# Patient Record
Sex: Male | Born: 1960 | Race: Black or African American | Hispanic: No | Marital: Single | Smoking: Never smoker
Health system: Southern US, Community
[De-identification: ages and names within clinical notes are randomized; demographics above are authoritative.]

## PROBLEM LIST (undated history)

## (undated) DIAGNOSIS — K4090 Unilateral inguinal hernia, without obstruction or gangrene, not specified as recurrent: Secondary | ICD-10-CM

## (undated) HISTORY — DX: Unilateral inguinal hernia, without obstruction or gangrene, not specified as recurrent: K40.90

## (undated) HISTORY — PX: VASECTOMY: SHX75

---

## 2002-02-20 HISTORY — PX: WRIST SURGERY: SHX841

## 2013-02-20 DIAGNOSIS — K4090 Unilateral inguinal hernia, without obstruction or gangrene, not specified as recurrent: Secondary | ICD-10-CM

## 2013-02-20 HISTORY — DX: Unilateral inguinal hernia, without obstruction or gangrene, not specified as recurrent: K40.90

## 2013-07-04 ENCOUNTER — Encounter (INDEPENDENT_AMBULATORY_CARE_PROVIDER_SITE_OTHER): Payer: Self-pay | Admitting: General Surgery

## 2013-07-04 ENCOUNTER — Ambulatory Visit (INDEPENDENT_AMBULATORY_CARE_PROVIDER_SITE_OTHER): Payer: 59 | Admitting: General Surgery

## 2013-07-04 VITALS — BP 130/84 | HR 71 | Temp 97.6°F | Resp 16 | Ht 68.0 in | Wt 192.8 lb

## 2013-07-04 DIAGNOSIS — K4090 Unilateral inguinal hernia, without obstruction or gangrene, not specified as recurrent: Secondary | ICD-10-CM

## 2013-07-04 NOTE — Progress Notes (Signed)
Patient ID: Scott Gay Vilar, male   DOB: [birth year], 53 y.o.   MRN: [id number]  No chief complaint on file.   HPI Scott Gay Bangs is a 53 y.o. male.  The patient is a 53 year old male who is referred by Dr. Su GrandMarc Nesi for evaluation of a right inguinal hernia. The patient states that it's been there for approximately a month. He states he has some discomfort in his right inguinal area. He notices a bulge in his right groin. The patient had no signs or symptoms of incarceration. HPI  Past Medical History  Diagnosis Date  . Inguinal hernia 2015    No past surgical history on file.  No family history on file.  Social History History  Substance Use Topics  . Smoking status: Never Smoker   . Smokeless tobacco: Not on file  . Alcohol Use: Yes    No Known Allergies  No current outpatient prescriptions on file.   No current facility-administered medications for this visit.    Review of Systems Review of Systems  Constitutional: Negative.   HENT: Negative.   Eyes: Negative.   Respiratory: Negative.   Cardiovascular: Negative.   Gastrointestinal: Negative.   Endocrine: Negative.   Neurological: Negative.     Blood pressure 130/84, pulse 71, temperature 97.6 F (36.4 C), temperature source Temporal, resp. rate 16, height 5\' 8"  (1.727 m), weight 192 lb 12.8 oz (87.454 kg).  Physical Exam Physical Exam  Constitutional: He is oriented to person, place, and time. He appears well-developed and well-nourished.  HENT:  Head: Normocephalic and atraumatic.  Eyes: Conjunctivae and EOM are normal. Pupils are equal, round, and reactive to light.  Neck: Normal range of motion. Neck supple.  Cardiovascular: Normal rate, regular rhythm and normal heart sounds.   Pulmonary/Chest: Effort normal and breath sounds normal.  Abdominal: Soft. Bowel sounds are normal. He exhibits no distension and no mass. There is no tenderness. There is no rebound and no guarding. A hernia is present. Hernia  confirmed positive in the right inguinal area. Hernia confirmed negative in the left inguinal area.  Musculoskeletal: Normal range of motion.  Neurological: He is alert and oriented to person, place, and time.  Skin: Skin is warm and dry.    Data Reviewed none  Assessment    53 year old male with right inguinal hernia, likely indirect     Plan    1. The patient would like to proceed to the operating room for a laparoscopic right inguinal hernia repair with mesh. 2. All risks and benefits were discussed with the patient, to generally include infection, bleeding, damage to surrounding structures, acute and chronic nerve pain, and recurrence. Alternatives were offered and described.  All questions were answered and the patient voiced understanding of the procedure and wishes to proceed at this point.         Axel Fillerrmando Shawntell Dixson 07/04/2013, 2:31 PM

## 2013-07-08 ENCOUNTER — Other Ambulatory Visit (INDEPENDENT_AMBULATORY_CARE_PROVIDER_SITE_OTHER): Payer: Self-pay

## 2013-07-08 DIAGNOSIS — K4090 Unilateral inguinal hernia, without obstruction or gangrene, not specified as recurrent: Secondary | ICD-10-CM

## 2013-07-08 MED ORDER — OXYCODONE-ACETAMINOPHEN 5-325 MG PO TABS: 1.0000 | ORAL_TABLET | Freq: Four times a day (QID) | ORAL | Status: AC | PRN

## 2013-07-11 ENCOUNTER — Telehealth (INDEPENDENT_AMBULATORY_CARE_PROVIDER_SITE_OTHER): Payer: Self-pay

## 2013-07-11 ENCOUNTER — Encounter (INDEPENDENT_AMBULATORY_CARE_PROVIDER_SITE_OTHER): Payer: Self-pay

## 2013-07-11 NOTE — Telephone Encounter (Signed)
Patient calling into office requesting a RTW note.  Patient reports that he plans on returning to work on 07/16/13.  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  Patient will come by to pick up work note, will be at the front desk.  Patient s/p Inguinal hernia repair on 07/08/13.

## 2013-07-15 ENCOUNTER — Telehealth (INDEPENDENT_AMBULATORY_CARE_PROVIDER_SITE_OTHER): Payer: Self-pay | Admitting: General Surgery

## 2013-07-15 NOTE — Telephone Encounter (Signed)
Pt called with concern for swelling and slight discomfort in scrotum following IHR.  Reassured him this is to be expected and why.  Advised him to elevate his scrotum when seated to promote drainage into his abdomen for pick-up in circulation.  Also advised him of his post op appt on 07/24/13 at 11:40.  He understands all.

## 2013-07-23 NOTE — Progress Notes (Signed)
Office notes faxed to Dr. Brunilda Payor @ [facility] [phone], fax confirmation rec'd.

## 2013-07-24 ENCOUNTER — Encounter (INDEPENDENT_AMBULATORY_CARE_PROVIDER_SITE_OTHER): Payer: Self-pay | Admitting: General Surgery

## 2013-07-24 ENCOUNTER — Ambulatory Visit (INDEPENDENT_AMBULATORY_CARE_PROVIDER_SITE_OTHER): Payer: 59 | Admitting: General Surgery

## 2013-07-24 VITALS — BP 124/70 | HR 80 | Temp 97.8°F | Resp 12 | Ht 68.0 in | Wt 187.8 lb

## 2013-07-24 DIAGNOSIS — Z9889 Other specified postprocedural states: Secondary | ICD-10-CM

## 2013-07-24 DIAGNOSIS — Z8719 Personal history of other diseases of the digestive system: Secondary | ICD-10-CM

## 2013-07-24 NOTE — Progress Notes (Signed)
Patient ID: Scott Gay, male   DOB: [birth year], 53 y.o.   MRN: [id number] Post op course The patient is a 53 year old male status post laparoscopic right inguinal hernia repair with mesh. Patient has been doing well postoperatively. He had minimal pain.  On Exam: Was a clean dry intact, there is no hernia on palpation   Assessment and Plan 53 year old male status post laparoscopic right inguinal hernia repair with mesh. 1. Patient follow up as needed 2. We discussed with his restrictions for another 3-4 weeks t   Axel Filler, MD [facility] General & Minimally Invasive Surgery Trauma & Emergency Surgery

## 2018-05-31 ENCOUNTER — Ambulatory Visit
Admission: RE | Admit: 2018-05-31 | Discharge: 2018-05-31 | Disposition: A | Discharge status: Home / Self Care | Payer: 59 | Source: Ambulatory Visit | Attending: Family Medicine | Admitting: Family Medicine

## 2018-05-31 ENCOUNTER — Other Ambulatory Visit: Payer: Self-pay | Admitting: Family Medicine

## 2018-05-31 DIAGNOSIS — R918 Other nonspecific abnormal finding of lung field: Secondary | ICD-10-CM

## 2019-05-09 ENCOUNTER — Ambulatory Visit: Payer: 59 | Attending: Internal Medicine

## 2019-05-09 DIAGNOSIS — Z23 Encounter for immunization: Secondary | ICD-10-CM

## 2019-05-09 NOTE — Progress Notes (Signed)
   Covid-19 Vaccination Clinic  Name:  Scott Gay    MRN: [id number] DOB: 04-11-1960  05/09/2019  Scott Gay was observed post Covid-19 immunization for 15 minutes without incident. He was provided with Vaccine Information Sheet and instruction to access the V-Safe system.   Scott Gay was instructed to call 911 with any severe reactions post vaccine: Marland Kitchen Difficulty breathing  . Swelling of face and throat  . A fast heartbeat  . A bad rash all over body  . Dizziness and weakness   Immunizations Administered    Name Date Dose VIS Date Route   Pfizer COVID-19 Vaccine 05/09/2019  3:59 PM 0.3 mL 01/31/2019 Intramuscular   Manufacturer: [employer], [employer]   Lot: [id number]   NDC: [address]-1

## 2019-06-04 ENCOUNTER — Ambulatory Visit: Payer: 59 | Attending: Internal Medicine

## 2019-06-04 DIAGNOSIS — Z23 Encounter for immunization: Secondary | ICD-10-CM

## 2019-06-04 NOTE — Progress Notes (Signed)
   Covid-19 Vaccination Clinic  Name:  Sherrod Toothman    MRN: [id number] DOB: 1960/03/12  06/04/2019  Mr. Dubie was observed post Covid-19 immunization for 15 minutes without incident. He was provided with Vaccine Information Sheet and instruction to access the V-Safe system.   Mr. Lipford was instructed to call 911 with any severe reactions post vaccine: Marland Kitchen Difficulty breathing  . Swelling of face and throat  . A fast heartbeat  . A bad rash all over body  . Dizziness and weakness   Immunizations Administered    Name Date Dose VIS Date Route   Pfizer COVID-19 Vaccine 06/04/2019  9:02 AM 0.3 mL 01/31/2019 Intramuscular   Manufacturer: [employer], [employer]   Lot: [id number]   NDC: [address]-2

## 2020-03-30 IMAGING — CR CHEST - 2 VIEW
2 series · 2 of 2 positions shown · non-contrast
Comparison: 04/30/2018

CLINICAL DATA: Follow-up pneumonia

EXAM:
CHEST - 2 VIEW

[w chest pa]
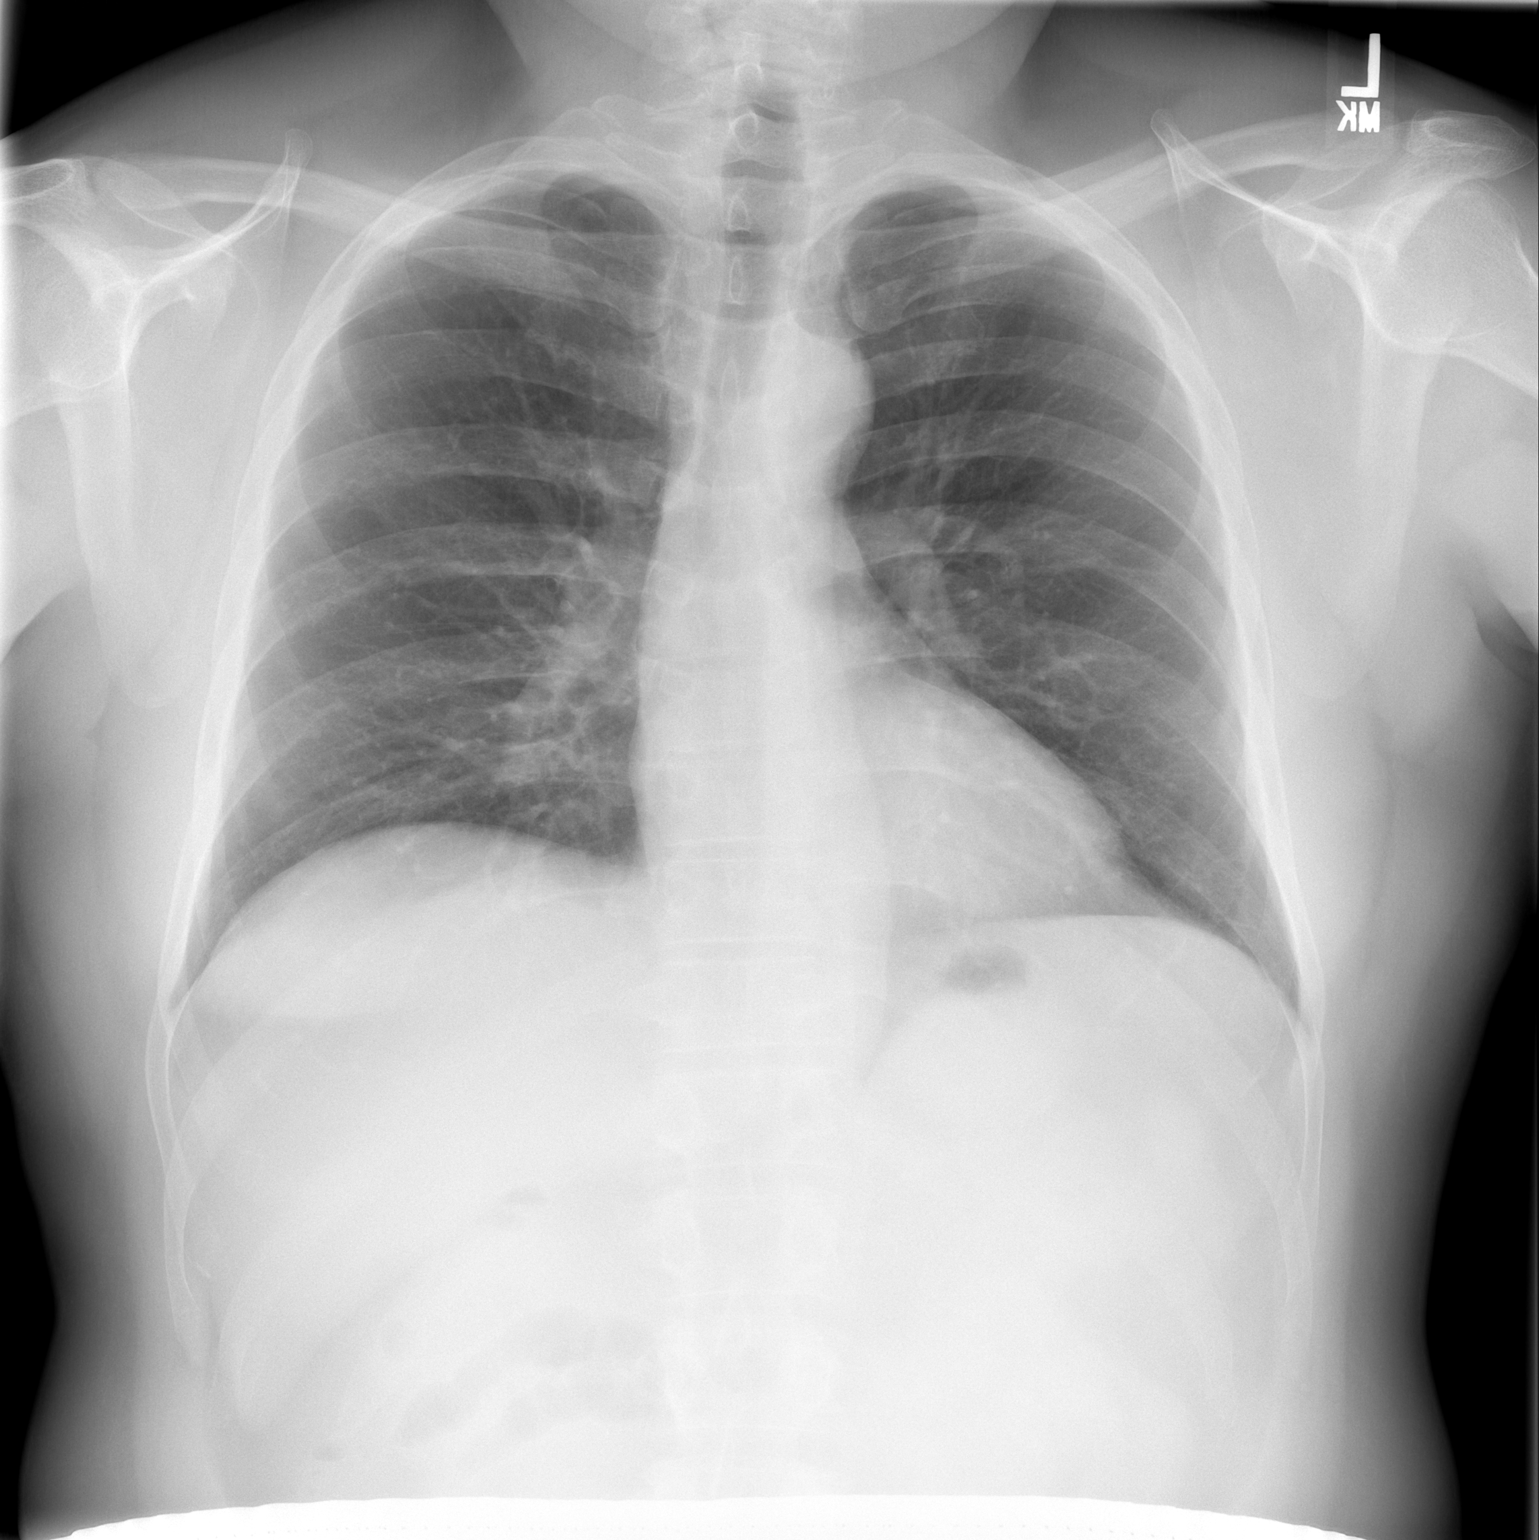

[w chest lat]
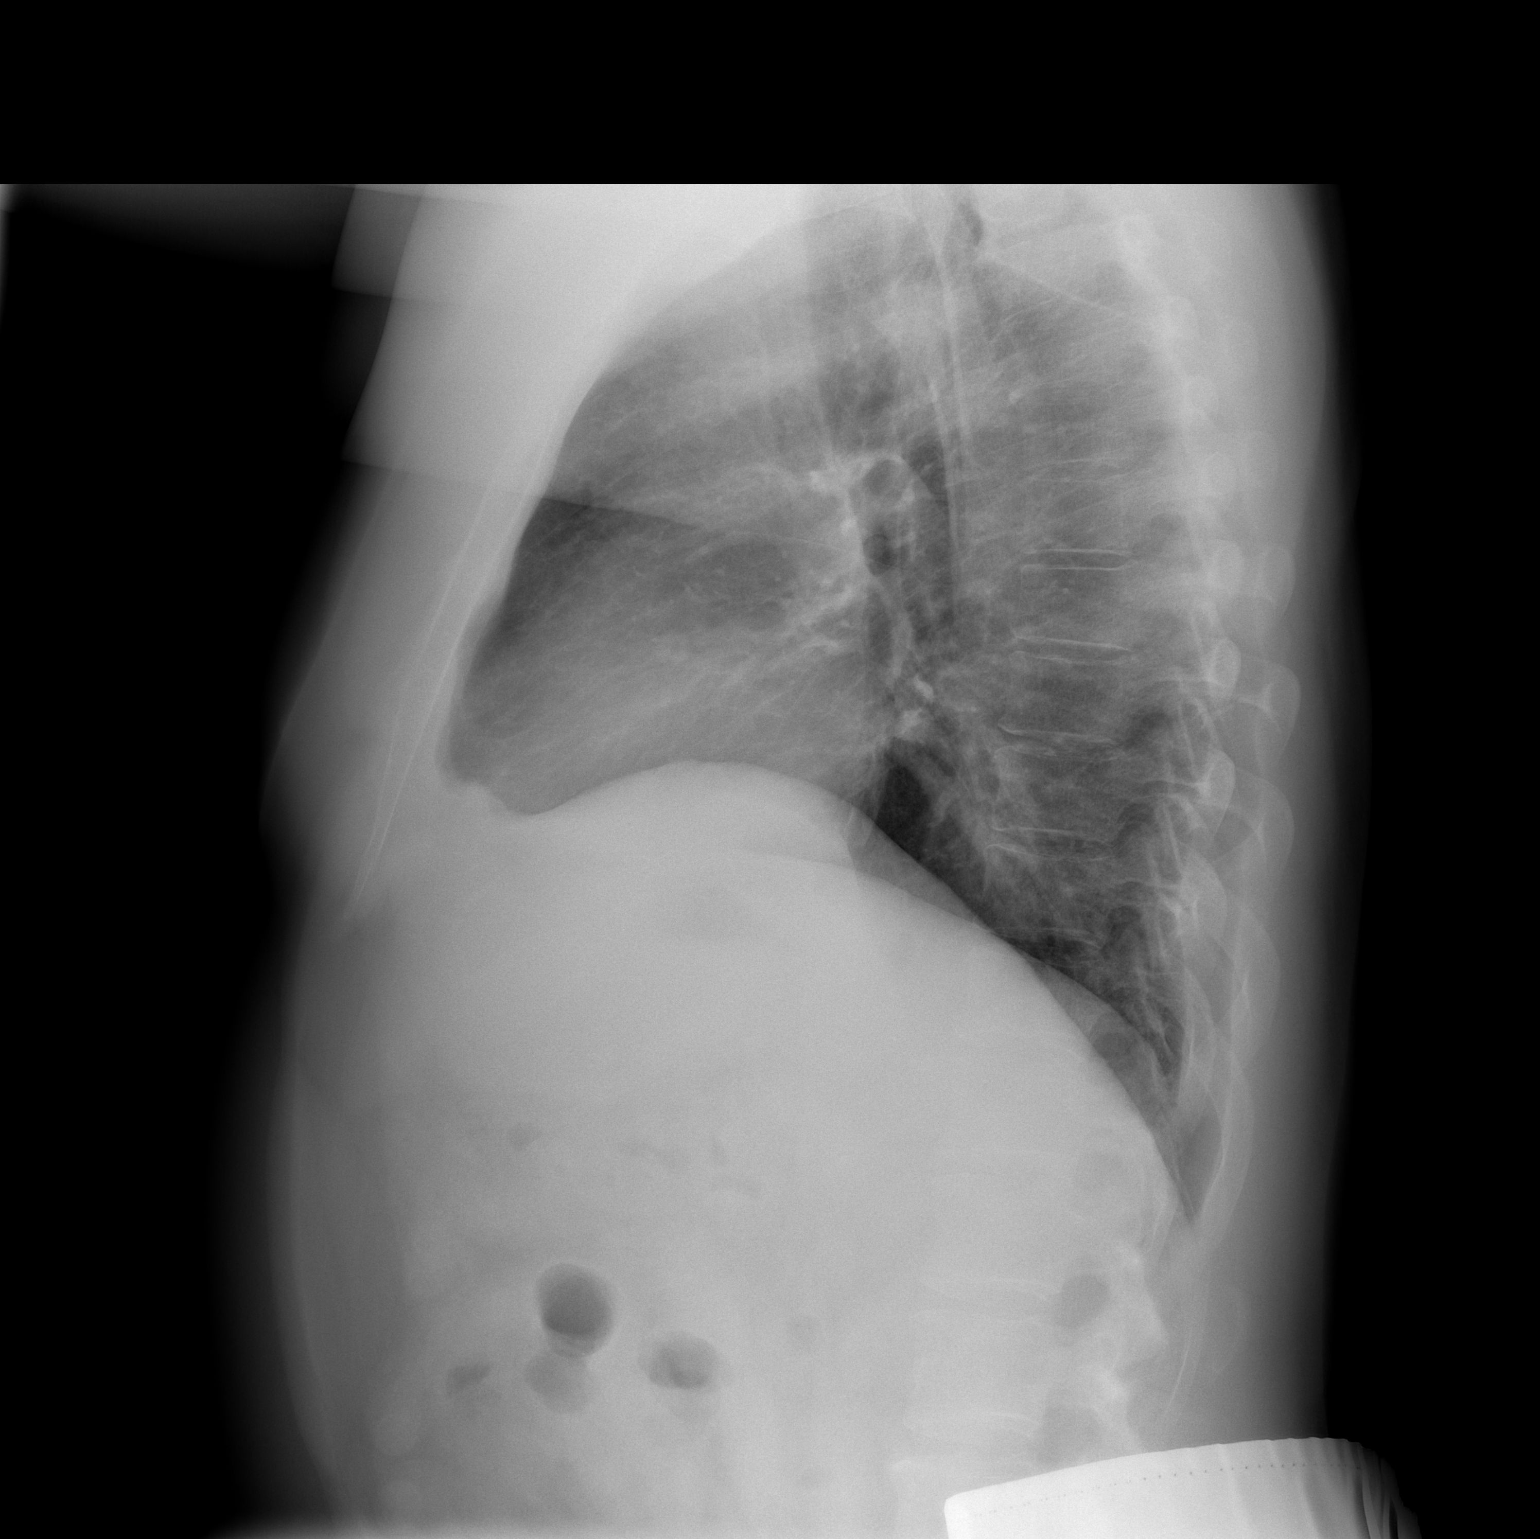

[2 of 2 positions shown; findings below may reference images not displayed]

FINDINGS: Interval clearance of the left lower lobe airspace opacity. Lungs
clear. Heart is normal size. No effusions or acute bony abnormality.
IMPRESSION: No active cardiopulmonary disease.

## 2020-05-19 DIAGNOSIS — J329 Chronic sinusitis, unspecified: Secondary | ICD-10-CM | POA: Diagnosis not present

## 2020-05-19 DIAGNOSIS — J40 Bronchitis, not specified as acute or chronic: Secondary | ICD-10-CM | POA: Diagnosis not present

## 2020-05-19 DIAGNOSIS — R059 Cough, unspecified: Secondary | ICD-10-CM | POA: Diagnosis not present

## 2020-08-04 DIAGNOSIS — M542 Cervicalgia: Secondary | ICD-10-CM | POA: Diagnosis not present

## 2020-11-09 DIAGNOSIS — R21 Rash and other nonspecific skin eruption: Secondary | ICD-10-CM | POA: Diagnosis not present

## 2020-11-22 DIAGNOSIS — R21 Rash and other nonspecific skin eruption: Secondary | ICD-10-CM | POA: Diagnosis not present

## 2021-01-03 DIAGNOSIS — L309 Dermatitis, unspecified: Secondary | ICD-10-CM | POA: Diagnosis not present

## 2022-08-02 DIAGNOSIS — R058 Other specified cough: Secondary | ICD-10-CM | POA: Diagnosis not present

## 2022-08-02 DIAGNOSIS — J40 Bronchitis, not specified as acute or chronic: Secondary | ICD-10-CM | POA: Diagnosis not present

## 2022-08-02 DIAGNOSIS — J019 Acute sinusitis, unspecified: Secondary | ICD-10-CM | POA: Diagnosis not present

## 2022-08-07 DIAGNOSIS — R053 Chronic cough: Secondary | ICD-10-CM | POA: Diagnosis not present

## 2022-08-07 DIAGNOSIS — J189 Pneumonia, unspecified organism: Secondary | ICD-10-CM | POA: Diagnosis not present

## 2022-08-22 DIAGNOSIS — J40 Bronchitis, not specified as acute or chronic: Secondary | ICD-10-CM | POA: Diagnosis not present

## 2022-08-22 DIAGNOSIS — J9811 Atelectasis: Secondary | ICD-10-CM | POA: Diagnosis not present

## 2022-08-30 ENCOUNTER — Encounter: Payer: Self-pay | Admitting: Physician Assistant

## 2022-09-25 DIAGNOSIS — Z0000 Encounter for general adult medical examination without abnormal findings: Secondary | ICD-10-CM | POA: Diagnosis not present

## 2022-09-25 DIAGNOSIS — Z23 Encounter for immunization: Secondary | ICD-10-CM | POA: Diagnosis not present

## 2022-09-25 DIAGNOSIS — Z13220 Encounter for screening for lipoid disorders: Secondary | ICD-10-CM | POA: Diagnosis not present

## 2022-09-25 DIAGNOSIS — Z131 Encounter for screening for diabetes mellitus: Secondary | ICD-10-CM | POA: Diagnosis not present

## 2022-09-25 DIAGNOSIS — Z125 Encounter for screening for malignant neoplasm of prostate: Secondary | ICD-10-CM | POA: Diagnosis not present

## 2022-09-26 ENCOUNTER — Other Ambulatory Visit: Payer: Self-pay | Admitting: Physician Assistant

## 2022-09-26 DIAGNOSIS — R053 Chronic cough: Secondary | ICD-10-CM

## 2022-09-26 DIAGNOSIS — J9811 Atelectasis: Secondary | ICD-10-CM

## 2022-09-26 DIAGNOSIS — J189 Pneumonia, unspecified organism: Secondary | ICD-10-CM

## 2022-10-05 DIAGNOSIS — Z13220 Encounter for screening for lipoid disorders: Secondary | ICD-10-CM | POA: Diagnosis not present

## 2022-10-05 DIAGNOSIS — Z131 Encounter for screening for diabetes mellitus: Secondary | ICD-10-CM | POA: Diagnosis not present

## 2022-10-05 DIAGNOSIS — Z125 Encounter for screening for malignant neoplasm of prostate: Secondary | ICD-10-CM | POA: Diagnosis not present

## 2022-10-09 DIAGNOSIS — H43393 Other vitreous opacities, bilateral: Secondary | ICD-10-CM | POA: Diagnosis not present

## 2022-10-09 DIAGNOSIS — H35411 Lattice degeneration of retina, right eye: Secondary | ICD-10-CM | POA: Diagnosis not present

## 2022-10-09 DIAGNOSIS — H33322 Round hole, left eye: Secondary | ICD-10-CM | POA: Diagnosis not present

## 2022-10-16 ENCOUNTER — Ambulatory Visit
Admission: RE | Admit: 2022-10-16 | Discharge: 2022-10-16 | Disposition: A | Discharge status: Home / Self Care | Payer: BC Managed Care – PPO | Source: Ambulatory Visit | Attending: Physician Assistant | Admitting: Physician Assistant

## 2022-10-16 DIAGNOSIS — R053 Chronic cough: Secondary | ICD-10-CM

## 2022-10-16 DIAGNOSIS — I2510 Atherosclerotic heart disease of native coronary artery without angina pectoris: Secondary | ICD-10-CM | POA: Diagnosis not present

## 2022-10-16 DIAGNOSIS — R058 Other specified cough: Secondary | ICD-10-CM | POA: Diagnosis not present

## 2022-10-16 DIAGNOSIS — J9811 Atelectasis: Secondary | ICD-10-CM

## 2022-10-16 DIAGNOSIS — J189 Pneumonia, unspecified organism: Secondary | ICD-10-CM

## 2022-10-16 DIAGNOSIS — I700 Atherosclerosis of aorta: Secondary | ICD-10-CM | POA: Diagnosis not present

## 2022-10-16 DIAGNOSIS — R918 Other nonspecific abnormal finding of lung field: Secondary | ICD-10-CM | POA: Diagnosis not present

## 2022-12-04 DIAGNOSIS — E782 Mixed hyperlipidemia: Secondary | ICD-10-CM | POA: Diagnosis not present

## 2023-07-31 DIAGNOSIS — M778 Other enthesopathies, not elsewhere classified: Secondary | ICD-10-CM | POA: Diagnosis not present

## 2023-08-29 ENCOUNTER — Other Ambulatory Visit (HOSPITAL_BASED_OUTPATIENT_CLINIC_OR_DEPARTMENT_OTHER): Payer: Self-pay

## 2023-08-29 ENCOUNTER — Ambulatory Visit (HOSPITAL_BASED_OUTPATIENT_CLINIC_OR_DEPARTMENT_OTHER): Admitting: Student

## 2023-08-29 ENCOUNTER — Encounter (HOSPITAL_BASED_OUTPATIENT_CLINIC_OR_DEPARTMENT_OTHER): Payer: Self-pay | Admitting: Student

## 2023-08-29 ENCOUNTER — Ambulatory Visit (HOSPITAL_BASED_OUTPATIENT_CLINIC_OR_DEPARTMENT_OTHER)

## 2023-08-29 DIAGNOSIS — M25522 Pain in left elbow: Secondary | ICD-10-CM | POA: Diagnosis not present

## 2023-08-29 DIAGNOSIS — M778 Other enthesopathies, not elsewhere classified: Secondary | ICD-10-CM

## 2023-08-29 MED ORDER — METHYLPREDNISOLONE 4 MG PO TBPK
ORAL_TABLET | ORAL | 0 refills | Status: AC
  Filled 2023-08-29: qty 21, 6d supply, fill #0

## 2023-08-29 NOTE — Progress Notes (Signed)
 Chief Complaint: Left elbow pain    Discussed the use of AI scribe software for clinical note transcription with the patient, who gave verbal consent to proceed.  History of Present Illness Scott Gay is a 63 year old left-hand-dominant male who presents with left elbow pain.  He has experienced left elbow pain for one month, initially evaluated by an orthopedic clinic in early June. The pain has worsened despite resting the elbow. It is shooting in nature, triggered by specific movements like turning the arm or getting out of bed. Initially, there was swelling and tightness, but range of motion has improved while the shooting pain persists. The pain has migrated to the back of the elbow, with soreness upon palpation and radiation upwards. Being left-handed, the pain affects daily activities. He has not used medication, only rest, and suspects the pain began during tricep exercises. He is concerned about managing the pain during his upcoming travel to the [place] for his wedding in two weeks.   Surgical History:   None  PMH/PSH/Family History/Social History/Meds/Allergies:    Past Medical History:  Diagnosis Date   Inguinal hernia 2015   Past Surgical History:  Procedure Laterality Date   VASECTOMY     WRIST SURGERY  2004   Social History   Socioeconomic History   Marital status: Single    Spouse name: Not on file   Number of children: Not on file   Years of education: Not on file   Highest education level: Not on file  Occupational History   Not on file  Tobacco Use   Smoking status: Never   Smokeless tobacco: Not on file  Substance and Sexual Activity   Alcohol use: Yes   Drug use: No   Sexual activity: Not on file  Other Topics Concern   Not on file  Social History Narrative   Not on file   Social Drivers of Health   Financial Resource Strain: Not on file  Food Insecurity: Not on file  Transportation Needs: Not  on file  Physical Activity: Not on file  Stress: Not on file  Social Connections: Not on file   History reviewed. No pertinent family history. No Known Allergies Current Outpatient Medications  Medication Sig Dispense Refill   methylPREDNISolone  (MEDROL  DOSEPAK) 4 MG TBPK tablet Take per packet instructions 1 each 0   No current facility-administered medications for this visit.   No results found.  Review of Systems:   A ROS was performed including pertinent positives and negatives as documented in the HPI.  Physical Exam :   Constitutional: NAD and appears stated age Neurological: Alert and oriented Psych: Appropriate affect and cooperative There were no vitals taken for this visit.   Comprehensive Musculoskeletal Exam:    Exam of the left elbow demonstrates active range of motion from 0 to 130 degrees.  Minimal tenderness over the lateral epicondyle and common extensor tendon.  No pain is reciprocated with resisted wrist extension.  Tenderness is noted of the distal triceps tendons and is reciprocated with resisted elbow extension although strength is well-maintained.  Imaging:   Xray (left elbow 4 views): Mild spurring of the coronoid process but otherwise negative for bony abnormality   I personally reviewed and interpreted the radiographs.      Assessment & Plan Left elbow pain  Posterior left elbow pain is likely due to triceps tendonitis, exacerbated initially during triceps exercises.  Some pain is radiating around toward the lateral epicondyle, although testing for lateral epicondylitis is generally negative. Prescribe an oral steroid taper for symptom relief. Refer to physical therapy for targeted triceps rehab.  May consider shockwave therapy with Dr. Burnetta if symptoms persist. Advise avoiding heavy weightlifting.      I personally saw and evaluated the patient, and participated in the management and treatment plan.  Leonce Reveal, PA-C Orthopedics

## 2023-09-18 ENCOUNTER — Ambulatory Visit (HOSPITAL_BASED_OUTPATIENT_CLINIC_OR_DEPARTMENT_OTHER)

## 2023-09-18 ENCOUNTER — Other Ambulatory Visit (HOSPITAL_BASED_OUTPATIENT_CLINIC_OR_DEPARTMENT_OTHER): Payer: Self-pay | Admitting: Physician Assistant

## 2023-09-18 ENCOUNTER — Encounter: Payer: Self-pay | Admitting: Orthopedic Surgery

## 2023-09-18 ENCOUNTER — Encounter (HOSPITAL_BASED_OUTPATIENT_CLINIC_OR_DEPARTMENT_OTHER): Payer: Self-pay | Admitting: Physician Assistant

## 2023-09-18 ENCOUNTER — Ambulatory Visit (HOSPITAL_BASED_OUTPATIENT_CLINIC_OR_DEPARTMENT_OTHER): Admitting: Physician Assistant

## 2023-09-18 DIAGNOSIS — M25562 Pain in left knee: Secondary | ICD-10-CM

## 2023-09-18 DIAGNOSIS — M25462 Effusion, left knee: Secondary | ICD-10-CM | POA: Diagnosis not present

## 2023-09-18 DIAGNOSIS — M1712 Unilateral primary osteoarthritis, left knee: Secondary | ICD-10-CM | POA: Diagnosis not present

## 2023-09-18 NOTE — Progress Notes (Signed)
 Office Visit Note   Patient: Scott Gay           Date of Birth: Scott-Sep-1962           MRN: [id number] Visit Date: 09/18/2023              Requested by: No referring provider defined for this encounter. PCP: Patient, No Pcp Per   Assessment & Plan: Visit Diagnoses:  1. Acute pain of left knee     Plan: Patient is a pleasant 63 year old active Gay who comes in today with acute pain catching locking and swelling on the medial side of his left knee.  He denies any specific injury.  He gets a lot of stiffness.  He does play golf quite a bit.  He has tried medication and ice without relief.  He has difficulty going up and down stairs.  He said he has not had anything related to this in the past.  He does have some mechanical symptoms.  He would like to define better what is going on with his knee.  As it is significantly painful even when he rolls over at night in a certain way he gets a sharp medial pain.  He does have some degenerative changes of the medial joint with some varus alignment.  Very small periarticular osteophytes  this was demonstrated on the x-ray today he also has some grinding with range of motion.  He would like to get an MRI possible to rule out a mechanical problem such as a meniscus tear.  We discussed if it shows significant arthritis and a meniscus tear would recommend therapy and an injection.  If however the arthritis is not as significant but has an acute meniscus tear may be a candidate for an arthroscopic procedure  Follow-Up Instructions: After MRI  Orders:  No orders of the defined types were placed in this encounter.  No orders of the defined types were placed in this encounter.     Procedures: No procedures performed   Clinical Data: No additional findings.   Subjective: Chief Complaint  Patient presents with   Left Knee - Pain    HPI patient is a pleasant Scott Gay with a chief complaint of left knee pain.  He feels  crackling and has swelling in the knee is sore to touch.  Pain when walking.  He gets pain just when he lays down and goes down his leg sometimes.  Occasionally has tingling and numbness.  He has tried ice and Tylenol   Review of Systems  All other systems reviewed and are negative.    Objective: Vital Signs: There were no vitals taken for this visit.  Physical Exam Constitutional:      Appearance: Normal appearance.  Pulmonary:     Effort: Pulmonary effort is normal.  Skin:    General: Skin is warm and dry.  Neurological:     General: No focal deficit present.     Mental Status: He is alert and oriented to person, place, and time.     Ortho Exam Examination of his left knee he has mild swelling but no effusion no redness.  Compartments are soft and compressible he is neurovascularly intact he does have grinding at the patellofemoral joint with range of motion.  He has good extension and flexion of his knee a little bit of pain reproduced posterior medially with terminal extension and flexion.  Good stability with anterior posterior varus and valgus stressing Specialty Comments:  No specialty comments available.  Imaging: No results found.   PMFS History: There are no active problems to display for this patient.  Past Medical History:  Diagnosis Date   Inguinal hernia 2015    History reviewed. No pertinent family history.  Past Surgical History:  Procedure Laterality Date   VASECTOMY     WRIST SURGERY  2004   Social History   Occupational History   Not on file  Tobacco Use   Smoking status: Never   Smokeless tobacco: Not on file  Substance and Sexual Activity   Alcohol use: Yes   Drug use: No   Sexual activity: Not on file

## 2023-09-20 ENCOUNTER — Inpatient Hospital Stay
Admission: RE | Admit: 2023-09-20 | Discharge: 2023-09-20 | Discharge status: Home / Self Care | Source: Ambulatory Visit | Attending: Physician Assistant

## 2023-09-20 DIAGNOSIS — M25562 Pain in left knee: Secondary | ICD-10-CM

## 2023-09-21 ENCOUNTER — Ambulatory Visit
Admission: RE | Admit: 2023-09-21 | Discharge: 2023-09-21 | Disposition: A | Discharge status: Home / Self Care | Source: Ambulatory Visit | Attending: Physician Assistant | Admitting: Physician Assistant

## 2023-09-27 ENCOUNTER — Encounter (HOSPITAL_BASED_OUTPATIENT_CLINIC_OR_DEPARTMENT_OTHER): Payer: Self-pay | Admitting: Physician Assistant

## 2023-09-27 ENCOUNTER — Ambulatory Visit (HOSPITAL_BASED_OUTPATIENT_CLINIC_OR_DEPARTMENT_OTHER): Admitting: Physician Assistant

## 2023-09-27 DIAGNOSIS — M25562 Pain in left knee: Secondary | ICD-10-CM | POA: Diagnosis not present

## 2023-09-27 NOTE — Progress Notes (Signed)
   Office Visit Note   Patient: Scott Gay           Date of Birth: 01-16-1961           MRN: [id number] Visit Date: 09/27/2023              Requested by: No referring provider defined for this encounter. PCP: Patient, No Pcp Per  Chief Complaint  Patient presents with   Left Knee - Follow-up      HPI: Patient is a pleasant 63 year old gentleman comes in today for review of MRI of his left knee.  He had seen me and has a mechanical symptoms and pain on the medial side of his knee.  Assessment & Plan: Visit Diagnoses:  1. 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  He just has slight fraying of the medial meniscus without a discrete tear.  We discussed that I would recommend trying an injection.  He would like to think about this.  Ultimately I could refer him to one of our orthopedic surgeons for further evaluation he can let me know  Follow-Up Instructions: No follow-ups on file.   Ortho Exam  Patient is alert, oriented, no adenopathy, well-dressed, normal affect, normal respiratory effort.     Imaging: No results found. No images are attached to the encounter.  Labs: No results found for: HGBA1C, ESRSEDRATE, CRP, LABURIC, REPTSTATUS, GRAMSTAIN, CULT, LABORGA   No results found for: ALBUMIN, PREALBUMIN, CBC  No results found for: MG No results found for: VD25OH  No results found for: PREALBUMIN     No data to display           There is no height or weight on file to calculate BMI.  Orders:  No orders of the defined types were placed in this encounter.  No orders of the defined types were placed in this encounter.    Procedures: No procedures performed  Clinical Data: No additional findings.  ROS:  All other systems  negative, except as noted in the HPI. Review of Systems  Objective: Vital Signs: There were no vitals taken for this visit.  Specialty Comments:  No specialty comments available.  PMFS History: Patient Active Problem List   Diagnosis Date Noted   Pain in left knee 09/27/2023   Past Medical History:  Diagnosis Date   Inguinal hernia 2015    History reviewed. No pertinent family history.  Past Surgical History:  Procedure Laterality Date   VASECTOMY     WRIST SURGERY  02/20/2002   Social History   Occupational History   Not on file  Tobacco Use   Smoking status: Never   Smokeless tobacco: Not on file  Substance and Sexual Activity   Alcohol use: Yes   Drug use: No   Sexual activity: Not on file

## 2023-10-26 DIAGNOSIS — B349 Viral infection, unspecified: Secondary | ICD-10-CM | POA: Diagnosis not present

## 2023-12-24 ENCOUNTER — Encounter: Payer: Self-pay | Admitting: Radiology

## 2024-01-15 DIAGNOSIS — Z125 Encounter for screening for malignant neoplasm of prostate: Secondary | ICD-10-CM | POA: Diagnosis not present

## 2024-01-15 DIAGNOSIS — E782 Mixed hyperlipidemia: Secondary | ICD-10-CM | POA: Diagnosis not present

## 2024-01-15 DIAGNOSIS — Z23 Encounter for immunization: Secondary | ICD-10-CM | POA: Diagnosis not present

## 2024-01-15 DIAGNOSIS — Z1211 Encounter for screening for malignant neoplasm of colon: Secondary | ICD-10-CM | POA: Diagnosis not present

## 2024-01-15 DIAGNOSIS — Z0000 Encounter for general adult medical examination without abnormal findings: Secondary | ICD-10-CM | POA: Diagnosis not present
